# Patient Record
Sex: Male | Born: 1970 | Hispanic: Yes | Marital: Single | Smoking: Current every day smoker
Health system: Southern US, Community
[De-identification: ages and names within clinical notes are randomized; demographics above are authoritative.]

## PROBLEM LIST (undated history)

## (undated) DIAGNOSIS — S069XAA Unspecified intracranial injury with loss of consciousness status unknown, initial encounter: Secondary | ICD-10-CM

## (undated) DIAGNOSIS — S069X9A Unspecified intracranial injury with loss of consciousness of unspecified duration, initial encounter: Secondary | ICD-10-CM

## (undated) HISTORY — PX: BRAIN SURGERY: SHX531

---

## 2014-11-04 ENCOUNTER — Encounter (HOSPITAL_COMMUNITY): Payer: Self-pay | Admitting: Emergency Medicine

## 2014-11-04 ENCOUNTER — Emergency Department (HOSPITAL_COMMUNITY)
Admission: EM | Admit: 2014-11-04 | Discharge: 2014-11-04 | Disposition: A | Discharge status: Home / Self Care | Payer: Self-pay | Attending: Emergency Medicine | Admitting: Emergency Medicine

## 2014-11-04 ENCOUNTER — Emergency Department (HOSPITAL_COMMUNITY): Payer: Self-pay

## 2014-11-04 DIAGNOSIS — Z720 Tobacco use: Secondary | ICD-10-CM | POA: Insufficient documentation

## 2014-11-04 DIAGNOSIS — S0990XA Unspecified injury of head, initial encounter: Secondary | ICD-10-CM | POA: Insufficient documentation

## 2014-11-04 DIAGNOSIS — Z87820 Personal history of traumatic brain injury: Secondary | ICD-10-CM | POA: Insufficient documentation

## 2014-11-04 DIAGNOSIS — W1839XA Other fall on same level, initial encounter: Secondary | ICD-10-CM | POA: Insufficient documentation

## 2014-11-04 DIAGNOSIS — Y9289 Other specified places as the place of occurrence of the external cause: Secondary | ICD-10-CM | POA: Insufficient documentation

## 2014-11-04 DIAGNOSIS — Y9331 Activity, mountain climbing, rock climbing and wall climbing: Secondary | ICD-10-CM | POA: Insufficient documentation

## 2014-11-04 DIAGNOSIS — Y998 Other external cause status: Secondary | ICD-10-CM | POA: Insufficient documentation

## 2014-11-04 HISTORY — DX: Unspecified intracranial injury with loss of consciousness status unknown, initial encounter: S06.9XAA

## 2014-11-04 HISTORY — DX: Unspecified intracranial injury with loss of consciousness of unspecified duration, initial encounter: S06.9X9A

## 2014-11-04 NOTE — Discharge Instructions (Signed)
If you were given medicines take as directed.  If you are on coumadin or contraceptives realize their levels and effectiveness is altered by many different medicines.  If you have any reaction (rash, tongues swelling, other) to the medicines stop taking and see a physician.   Please follow up as directed and return to the ER or see a physician for new or worsening symptoms.  Thank you. Filed Vitals:   11/04/14 1636  BP: 141/87  Pulse: 87  Temp: 98.9 F (37.2 C)  TempSrc: Oral  Resp: 20  Height: 5\' 6"  (1.676 m)  Weight: 164 lb (74.39 kg)  SpO2: 100%

## 2014-11-04 NOTE — ED Provider Notes (Signed)
CSN: [id number]     Arrival date & time 11/04/14  1612 History   First MD Initiated Contact with Patient 11/04/14 1641     Chief Complaint  Patient presents with  . Fall     (Consider location/radiation/quality/duration/timing/severity/associated sxs/prior Treatment) HPI Comments: 44 year-old male with no significant medical history except for traumatic head bleed at the age of 44 from a fall rockclimbing presents with posterior head pain and mild headache in/evening when he fell back to feet and his head on metal. No vomiting or neurologic complaints. No blood thinners. Mild tender to palpation.  Patient is a 44 y.o. male presenting with fall. The history is provided by the patient.  Fall Associated symptoms include headaches. Pertinent negatives include no chest pain, no abdominal pain and no shortness of breath.    Past Medical History  Diagnosis Date  . Brain injury    Past Surgical History  Procedure Laterality Date  . Brain surgery     History reviewed. No pertinent family history. History  Substance Use Topics  . Smoking status: Current Every Day Smoker -- 0.01 packs/day    Types: Cigarettes  . Smokeless tobacco: Never Used  . Alcohol Use: 7.2 oz/week    12 Cans of beer per week    Review of Systems  Constitutional: Negative for fever and chills.  HENT: Negative for congestion.   Eyes: Negative for visual disturbance.  Respiratory: Negative for shortness of breath.   Cardiovascular: Negative for chest pain.  Gastrointestinal: Negative for vomiting and abdominal pain.  Genitourinary: Negative for dysuria and flank pain.  Musculoskeletal: Negative for back pain, neck pain and neck stiffness.  Skin: Positive for wound. Negative for rash.  Neurological: Positive for headaches. Negative for light-headedness.      Allergies  Review of patient's allergies indicates no known allergies.  Home Medications   Prior to Admission medications   Not on File   BP 141/87  mmHg  Pulse 87  Temp(Src) 98.9 F (37.2 C) (Oral)  Resp 20  Ht 5\' 6"  (1.676 m)  Wt 164 lb (74.39 kg)  BMI 26.48 kg/m2  SpO2 100% Physical Exam  Constitutional: He is oriented to person, place, and time. He appears well-developed and well-nourished.  HENT:  Head: Normocephalic.  Mild tenderness to palpation posterior scalp no significant hematoma no step-off  Eyes: Conjunctivae are normal. Right eye exhibits no discharge. Left eye exhibits no discharge.  Neck: Normal range of motion. Neck supple. No tracheal deviation present.  Cardiovascular: Normal rate and regular rhythm.   Pulmonary/Chest: Effort normal and breath sounds normal.  Abdominal: Soft. He exhibits no distension. There is no tenderness. There is no guarding.  Musculoskeletal: He exhibits no edema.  Neurological: He is alert and oriented to person, place, and time.  Skin: Skin is warm. No rash noted.  Psychiatric: He has a normal mood and affect.  Nursing note and vitals reviewed.   ED Course  Procedures (including critical care time) Labs Review Labs Reviewed - No data to display  Imaging Review Ct Head Wo Contrast  11/04/2014   CLINICAL DATA:  Fall this morning.  Head trauma.  EXAM: CT HEAD WITHOUT CONTRAST  TECHNIQUE: Contiguous axial images were obtained from the base of the skull through the vertex without intravenous contrast.  COMPARISON:  None.  FINDINGS: Calvarium intact. Visible paranasal sinuses are within normal limits. No mass lesion, mass effect, midline shift, hydrocephalus, hemorrhage. No territorial ischemia or acute infarction. No encephalomalacia in this patient with a  history of head trauma.  IMPRESSION: Negative CT head.   Electronically Signed   By: Andreas NewportGeoffrey  Lamke M.D.   On: 11/04/2014 17:32     EKG Interpretation None      MDM   Final diagnoses:  Acute head injury, initial encounter   Well-appearing male with low risk head injury. CT scan was done in triage due to history of bleeding  from head injury in the past. CT scan results reviewed no acute findings. Results and differential diagnosis were discussed with the patient/parent/guardian. Close follow up outpatient was discussed, comfortable with the plan.   Medications - No data to display  Filed Vitals:   11/04/14 1636  BP: 141/87  Pulse: 87  Temp: 98.9 F (37.2 C)  TempSrc: Oral  Resp: 20  Height: 5\' 6"  (1.676 m)  Weight: 164 lb (74.39 kg)  SpO2: 100%    Final diagnoses:  Acute head injury, initial encounter       Blane OharaJoshua Jezel Basto, MD 11/04/14 1759

## 2014-11-04 NOTE — ED Notes (Addendum)
Patient fell this morning hitting back of head on piece of metal. Denies any LOC, dizziness, or blurred vision. Per patient nose bleed a little after hitting head. Denies taking any king of blood thinners. Patient does report a head injury with intracranial bleeding at age 44.

## 2016-03-10 IMAGING — CT CT HEAD W/O CM
1 of 2 series · 16 of 30 positions shown, 20 images · non-contrast
Comparison: None.

CLINICAL DATA: Fall this morning.  Head trauma.

EXAM:
CT HEAD WITHOUT CONTRAST
TECHNIQUE: Contiguous axial images were obtained from the base of the skull
through the vertex without intravenous contrast.

[Series 3: headtrauma 2.4 h60s · axial · 0.46mm/px · z∈[+71,+226]mm · 16 of 72 slices shown, 20 images]
[im 4/72  brain]
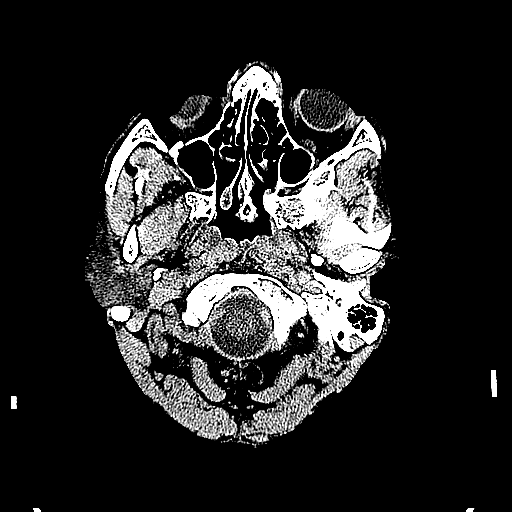
[im 4/72  bone]
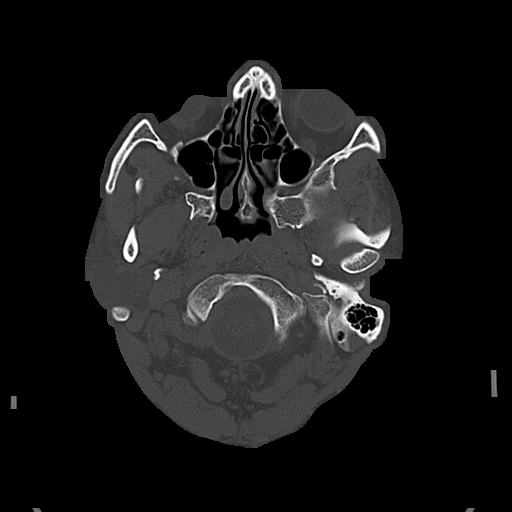
[im 8/72  brain]
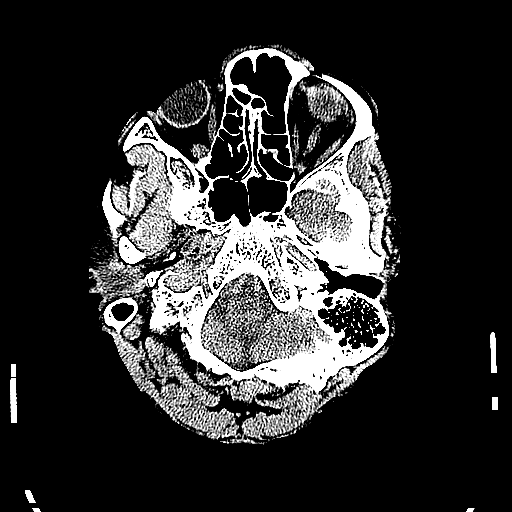
[im 12/72  brain]
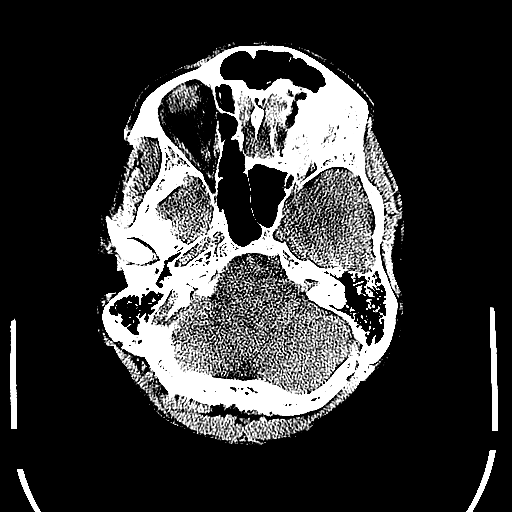
[im 15/72  brain]
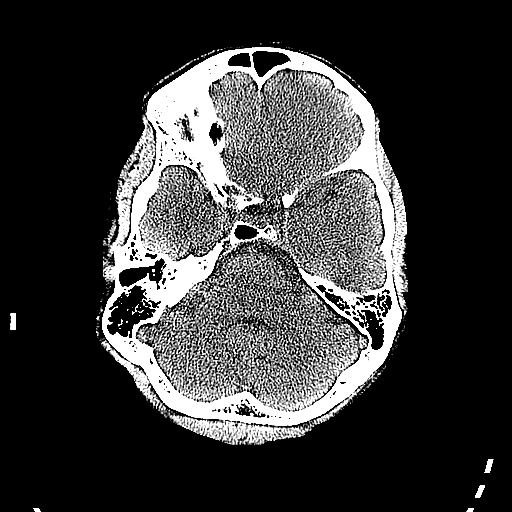
[im 23/72  brain]
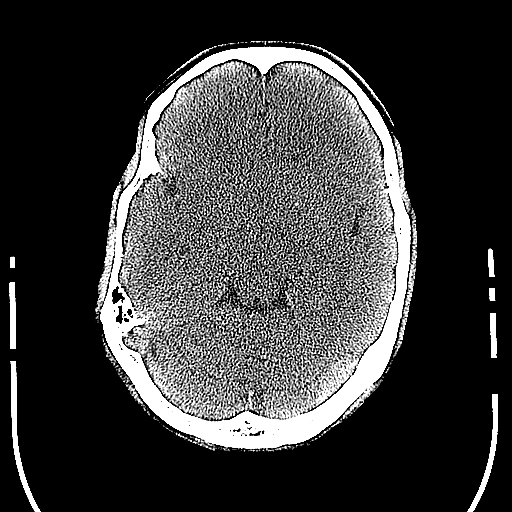
[im 23/72  bone]
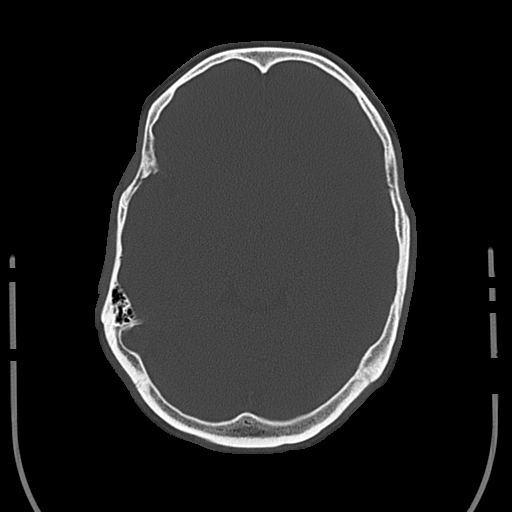
[im 27/72  brain]
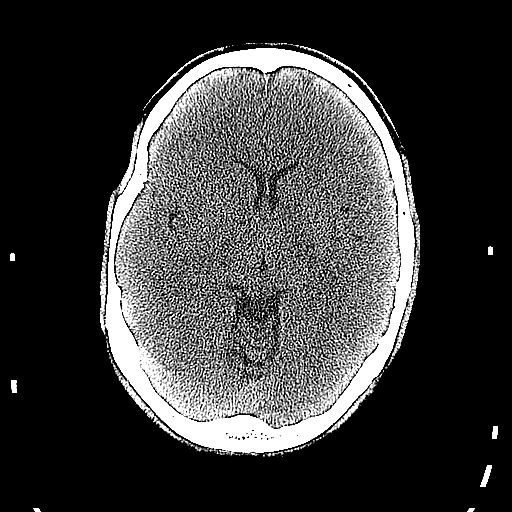
[im 30/72  brain]
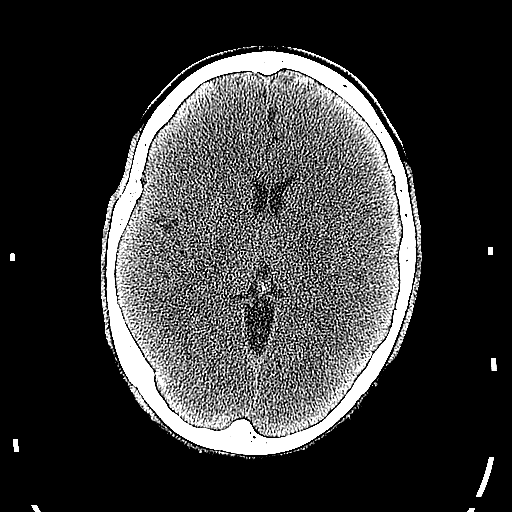
[im 34/72  brain]
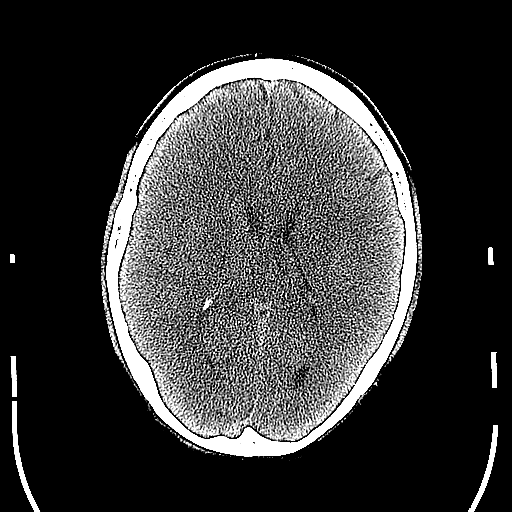
[im 38/72  brain]
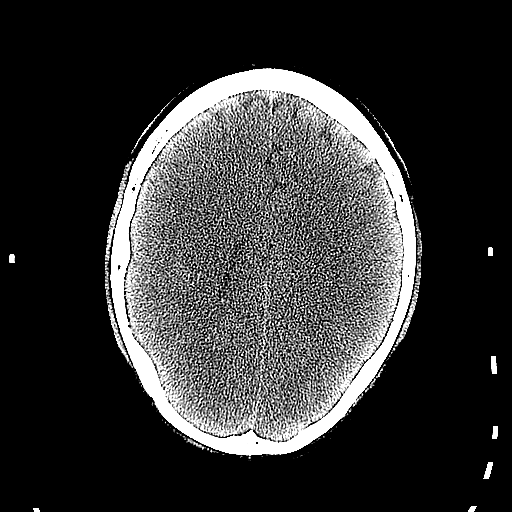
[im 38/72  bone]
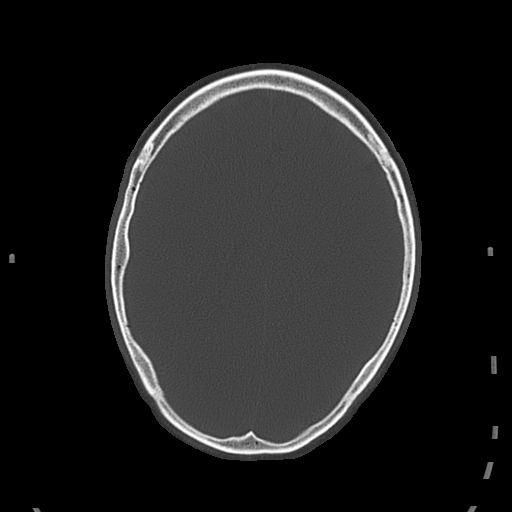
[im 42/72  brain]
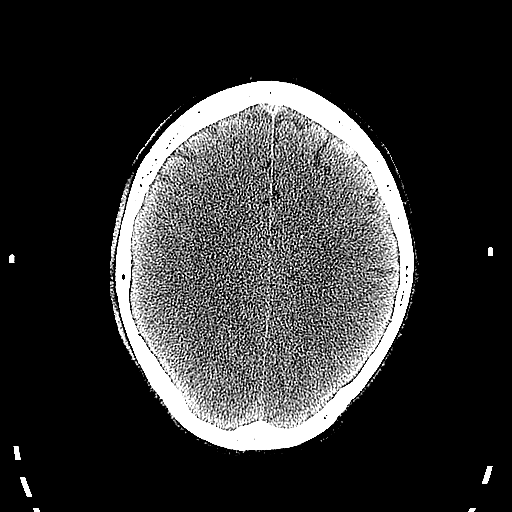
[im 45/72  brain]
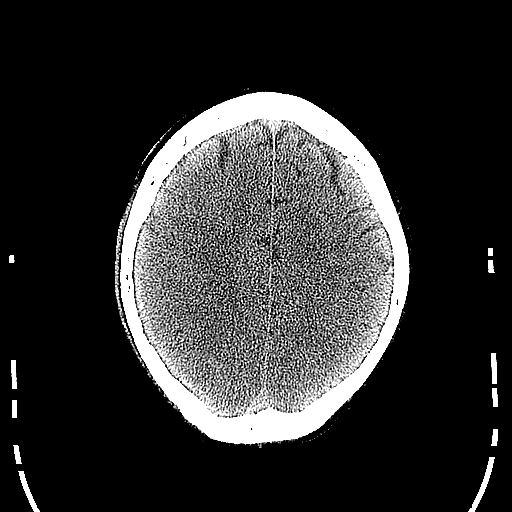
[im 49/72  brain]
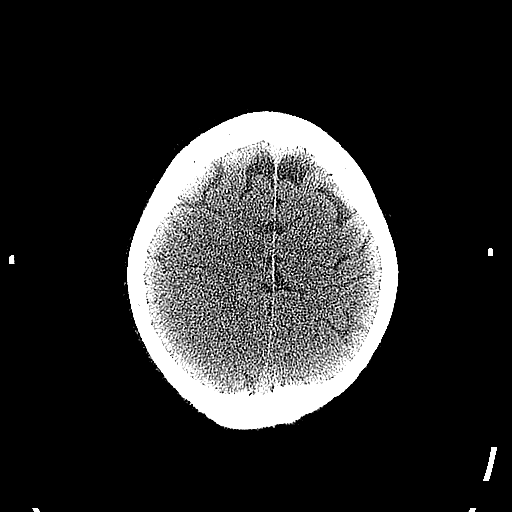
[im 57/72  brain]
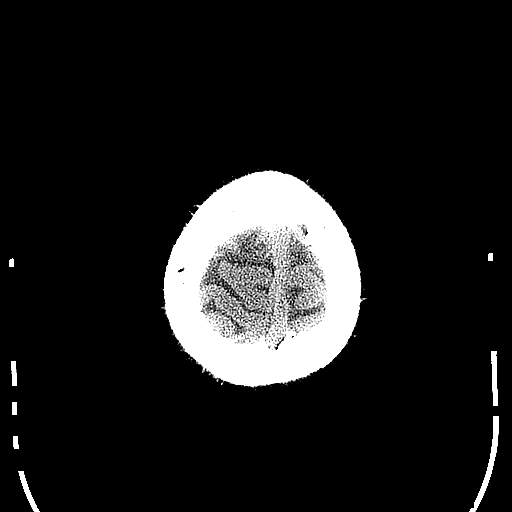
[im 57/72  bone]
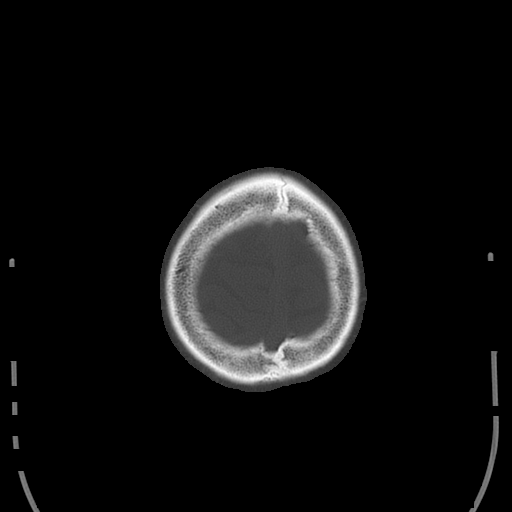
[im 60/72  brain]
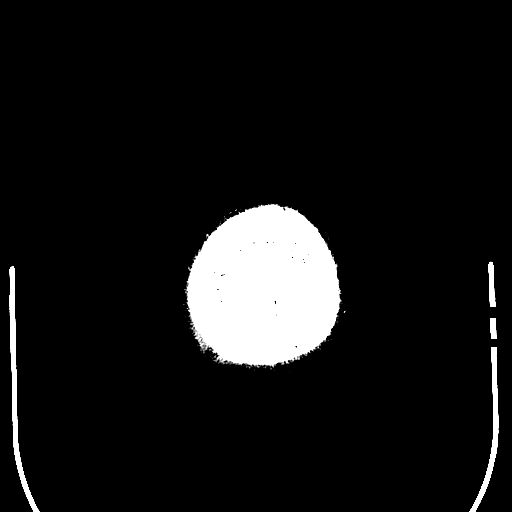
[im 64/72  brain]
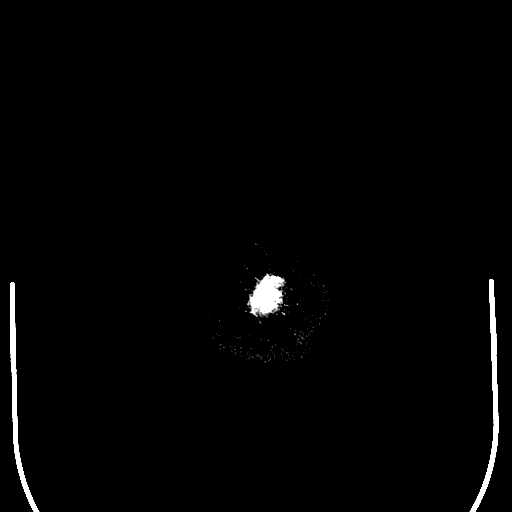
[im 68/72  brain]
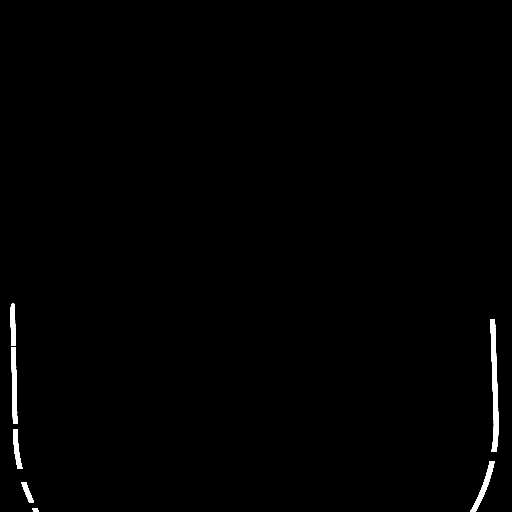

[16 of 30 positions shown; findings below may reference images not displayed]

FINDINGS: Calvarium intact. Visible paranasal sinuses are within normal
limits. No mass lesion, mass effect, midline shift, hydrocephalus,
hemorrhage. No territorial ischemia or acute infarction. No
encephalomalacia in this patient with a history of head trauma.
IMPRESSION: Negative CT head.
